# Patient Record
Sex: Female | Born: 2005 | Hispanic: No | Marital: Single | Smoking: Never smoker
Health system: Southern US, Community
[De-identification: ages and names within clinical notes are randomized; demographics above are authoritative.]

## PROBLEM LIST (undated history)

## (undated) DIAGNOSIS — R011 Cardiac murmur, unspecified: Secondary | ICD-10-CM

## (undated) DIAGNOSIS — S93401D Sprain of unspecified ligament of right ankle, subsequent encounter: Secondary | ICD-10-CM

## (undated) DIAGNOSIS — F419 Anxiety disorder, unspecified: Secondary | ICD-10-CM

## (undated) HISTORY — DX: Sprain of unspecified ligament of right ankle, subsequent encounter: S93.401D

## (undated) HISTORY — DX: Cardiac murmur, unspecified: R01.1

## (undated) HISTORY — DX: Anxiety disorder, unspecified: F41.9

---
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
## 2024-01-08 ENCOUNTER — Encounter: Payer: Self-pay | Admitting: Podiatry

## 2024-01-13 ENCOUNTER — Ambulatory Visit: Admitting: Podiatry

## 2024-02-04 ENCOUNTER — Other Ambulatory Visit

## 2024-02-19 ENCOUNTER — Ambulatory Visit
Admission: RE | Admit: 2024-02-19 | Discharge: 2024-02-19 | Disposition: A | Source: Ambulatory Visit | Attending: Podiatry

## 2024-02-19 DIAGNOSIS — G8929 Other chronic pain: Secondary | ICD-10-CM

## 2024-02-19 DIAGNOSIS — S93401D Sprain of unspecified ligament of right ankle, subsequent encounter: Secondary | ICD-10-CM

## 2024-02-19 DIAGNOSIS — R262 Difficulty in walking, not elsewhere classified: Secondary | ICD-10-CM

## 2024-02-22 ENCOUNTER — Ambulatory Visit: Payer: Self-pay | Admitting: Podiatry

## 2024-04-13 ENCOUNTER — Ambulatory Visit: Admitting: Podiatry

## 2024-04-13 DIAGNOSIS — M25571 Pain in right ankle and joints of right foot: Secondary | ICD-10-CM

## 2024-04-13 DIAGNOSIS — S93499A Sprain of other ligament of unspecified ankle, initial encounter: Secondary | ICD-10-CM | POA: Diagnosis not present

## 2024-04-13 DIAGNOSIS — M76821 Posterior tibial tendinitis, right leg: Secondary | ICD-10-CM

## 2024-04-13 MED ORDER — TRIAMCINOLONE ACETONIDE 10 MG/ML IJ SUSP
10.0000 mg | Freq: Once | INTRAMUSCULAR | Status: AC
  Administered 2024-04-13: 10 mg

## 2024-04-13 NOTE — Progress Notes (Signed)
 "   Chief Complaint  Patient presents with   Results    MRI results, injection?    Discussed the use of AI scribe software for clinical note transcription with the patient, who gave verbal consent to proceed.  History of Present Illness Julie Rocha is a 19 year old female with chronic right lateral ankle ligament tear who presents with persistent right ankle pain.  She is here to review the right ankle MRI and discuss treatment.  Her mother is with her today.  She reports gradual onset of right ankle stiffness, pain, and instability, which are exacerbated by ambulation and increased activity. Symptoms are most pronounced in the anterior ankle and improve with rest and sitting.  Over the past month, while less active and out of school, symptoms have diminished. Previously, increased walking led to more significant stiffness and discomfort.  She intermittently uses muscle relaxants on days with anticipated increased walking, which provides partial relief. She previously used an ankle brace during school but has not worn it recently due to decreased activity. She does not use orthotics or arch supports and typically wears flat shoes.  She reports that an MRI was performed and she was told there was a chronic tear of a lateral ankle ligament. She has not participated in physical therapy. She reports that her foot turns outward when she walks.    Past Medical History:  Diagnosis Date   Anxiety    Cardiac murmur    Moderate right ankle sprain, subsequent encounter    No past surgical history on file. Allergies[1] Physical Exam MUSCULOSKELETAL: Pain on palpation to the anterolateral and anterior aspect of the right ankle.  No crepitus with range of motion of the ankle.  No edema appreciated.  No pain on palpation of the posterior tibial tendon on the right.  No gaps or nodules are noted within the posterior tibial tendon or the peroneal tendons.  No erythema or ecchymosis noted.  Epicritic  sensation intact.  Palpable pedal pulses.  Results Radiology Right ankle MRI summary: Chronic lateral anterior talofibular ligament tear; no evidence of tendon rupture or talar dome injury; tendons intact.  Tenosynovitis of the posterior tibial tendon.  Assessment/Plan of Care: 1. Partial tear of ligament of lateral aspect of ankle   2. 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 No evidence of talar dome injury or tendon rupture. Represents a chronic instability with intermittent symptoms. Conservative management is preferred; repeated need for injections may indicate need for surgical intervention, which would be reserved as a last resort. - Administered right ankle corticosteroid injection for symptomatic relief.  This consisted of a mixture of 1% lidocaine plain, 0.5% Marcaine plain and Kenalog 10 for total of 1.5 cc administered.  She tolerated the injection well and a Band-Aid was applied.  She can remove this in 24 hours. - Provided counseling on activity modification post-injection, advising avoidance of swimming pools and hot tubs for several hours. - Referred to physical therapy for rehabilitation, to begin at least one week post-injection. - Referred to [facility] for custom-molded orthotics to provide arch support and control heel  motion. - Advised use of ankle brace as needed for pain or instability, but discouraged continuous use to prevent dependency and promote strengthening. - Educated on limitations of corticosteroid injections (maximum three per year per site) and discussed that  frequent need for injections may necessitate surgical intervention.  Posterior tibial tendinitis of right leg Posterior tibial tendinitis likely secondary to altered gait mechanics from chronic lateral ligament tear, as since she has been asymptomatic in this area since being seen here.  No evidence of acute inflammation or tendon rupture. Symptoms attributed to overuse and compensatory weight shifting due to ankle instability. - Included posterior tibial tendon in physical therapy referral to address gait mechanics and tendon support. - Provided education on the role of orthotics in supporting the arch and reducing strain on the posterior tibial tendon.  Follow-up as needed  Awanda CHARM Imperial, DPM, FACFAS [facility]     [address] [place], [place] [address]                Office [phone]  Fax [phone]      [1] No Known Allergies  "

## 2024-04-23 ENCOUNTER — Ambulatory Visit: Admitting: Podiatry
